# Patient Record
Sex: Female | Born: 2000 | Race: Black or African American | Hispanic: No | Marital: Single | Smoking: Never smoker
Health system: Southern US, Community
[De-identification: ages and names within clinical notes are randomized; demographics above are authoritative.]

## PROBLEM LIST (undated history)

## (undated) ENCOUNTER — Inpatient Hospital Stay (HOSPITAL_COMMUNITY): Payer: Self-pay

## (undated) DIAGNOSIS — Z789 Other specified health status: Secondary | ICD-10-CM

## (undated) HISTORY — PX: WISDOM TOOTH EXTRACTION: SHX21

---
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
## 2019-05-08 ENCOUNTER — Encounter: Payer: Self-pay | Admitting: Nurse Practitioner

## 2019-05-08 DIAGNOSIS — Z3046 Encounter for surveillance of implantable subdermal contraceptive: Secondary | ICD-10-CM | POA: Insufficient documentation

## 2019-05-08 DIAGNOSIS — Z8759 Personal history of other complications of pregnancy, childbirth and the puerperium: Secondary | ICD-10-CM | POA: Insufficient documentation

## 2019-05-09 ENCOUNTER — Ambulatory Visit: Payer: Medicaid Other | Admitting: Nurse Practitioner

## 2019-07-08 ENCOUNTER — Other Ambulatory Visit: Payer: Self-pay

## 2019-07-08 ENCOUNTER — Ambulatory Visit: Payer: Medicaid Other

## 2019-07-08 DIAGNOSIS — R350 Frequency of micturition: Secondary | ICD-10-CM

## 2019-07-08 NOTE — Progress Notes (Signed)
Patient is in the office to leave a urine sample for possible UTI. Pt reports urinary frequency.

## 2019-07-10 LAB — URINE CULTURE

## 2019-07-10 NOTE — Progress Notes (Signed)
I have reviewed this chart and agree with the RN/CMA assessment and management.    K. Meryl Davis, M.D. [facility]   

## 2019-11-18 ENCOUNTER — Other Ambulatory Visit: Payer: Self-pay

## 2019-11-18 ENCOUNTER — Other Ambulatory Visit (HOSPITAL_COMMUNITY)
Admission: RE | Admit: 2019-11-18 | Discharge: 2019-11-18 | Disposition: A | Discharge status: Home / Self Care | Payer: Medicaid Other | Source: Ambulatory Visit | Attending: Obstetrics | Admitting: Obstetrics

## 2019-11-18 ENCOUNTER — Ambulatory Visit (INDEPENDENT_AMBULATORY_CARE_PROVIDER_SITE_OTHER): Payer: Medicaid Other

## 2019-11-18 VITALS — Wt 130.6 lb

## 2019-11-18 DIAGNOSIS — Z202 Contact with and (suspected) exposure to infections with a predominantly sexual mode of transmission: Secondary | ICD-10-CM | POA: Diagnosis not present

## 2019-11-18 NOTE — Progress Notes (Signed)
Pt is here for STD screening, she requests vaginal swab only and declines bloodwork at this time. Pt denies any new partners or known exposure but states she would just like to be tested. Advised pt on how to do self swab and advised her we will notify her of results, pt voices understanding.

## 2019-11-19 LAB — CERVICOVAGINAL ANCILLARY ONLY
Bacterial Vaginitis (gardnerella): POSITIVE — AB
Candida Glabrata: NEGATIVE
Candida Vaginitis: NEGATIVE
Chlamydia: NEGATIVE
Comment: NEGATIVE
Comment: NEGATIVE
Comment: NEGATIVE
Comment: NEGATIVE
Comment: NEGATIVE
Comment: NORMAL
Neisseria Gonorrhea: NEGATIVE
Trichomonas: NEGATIVE

## 2019-11-20 ENCOUNTER — Other Ambulatory Visit: Payer: Self-pay | Admitting: Obstetrics

## 2019-11-20 DIAGNOSIS — B9689 Other specified bacterial agents as the cause of diseases classified elsewhere: Secondary | ICD-10-CM

## 2019-11-20 MED ORDER — METRONIDAZOLE 500 MG PO TABS: 500.0000 mg | ORAL_TABLET | Freq: Two times a day (BID) | ORAL | 2 refills | Status: DC

## 2019-12-17 NOTE — Progress Notes (Signed)
Patient was assessed and managed by nursing staff during this encounter. I have reviewed the chart and agree with the documentation and plan. I have also made any necessary editorial changes.  Catalina Antigua, MD 12/17/2019 3:21 PM

## 2020-04-26 IMAGING — US US OB < 14 WEEKS - US OB TV
1 series · 15 of 28 positions shown · non-contrast
Comparison: None.

CLINICAL DATA: 17-year-old pregnant female with shortness of
breath. LMP: 12/31/2017 corresponding to an estimated gestational
age of 6 weeks, 3 days.

EXAM:
OBSTETRIC <14 WK US AND TRANSVAGINAL OB US
TECHNIQUE: Both transabdominal and transvaginal ultrasound examinations were
performed for complete evaluation of the gestation as well as the
maternal uterus, adnexal regions, and pelvic cul-de-sac.
Transvaginal technique was performed to assess early pregnancy.

[Series 1: us ob < 14 weeks - us ob tv · 48 acquisitions, 15 frames shown]
[im 1/48]
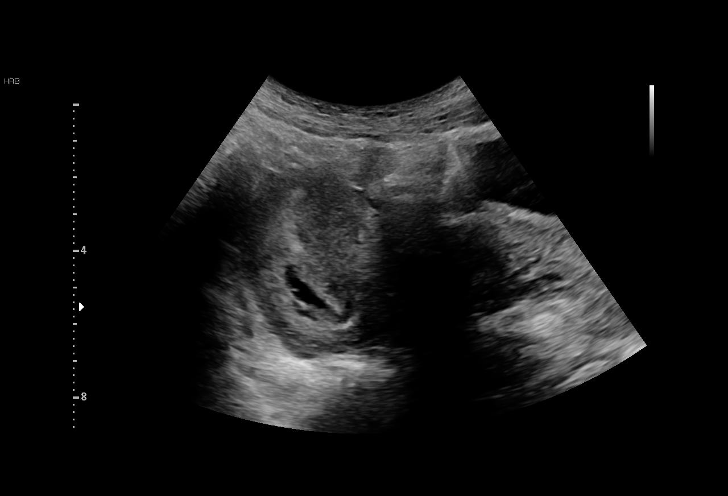
[im 4/48]
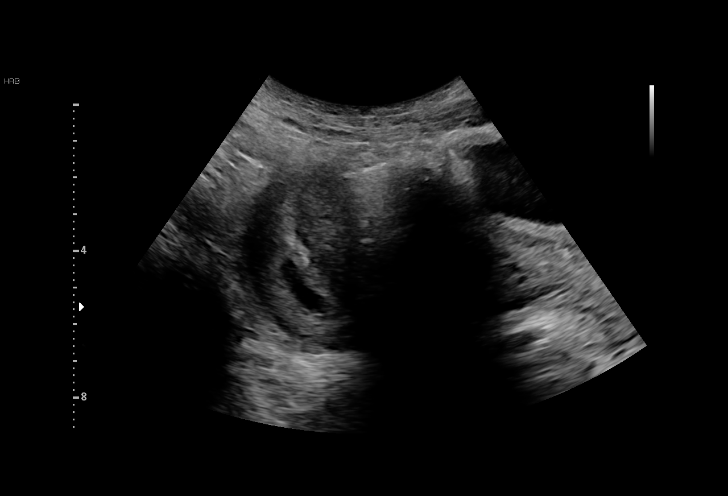
[im 7/48]
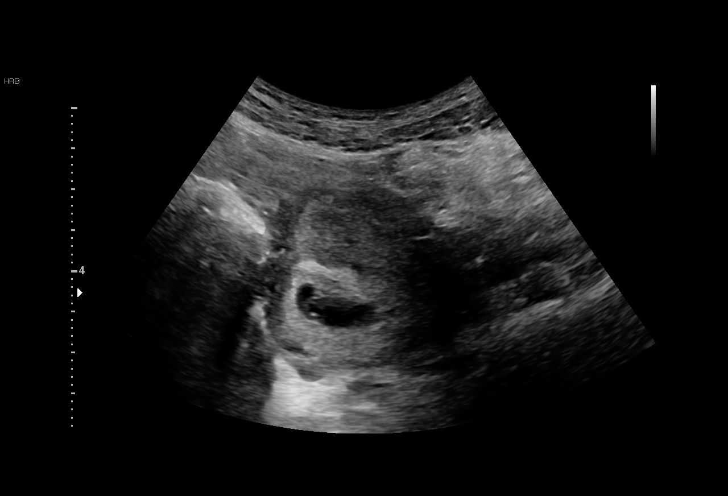
[im 11/48]
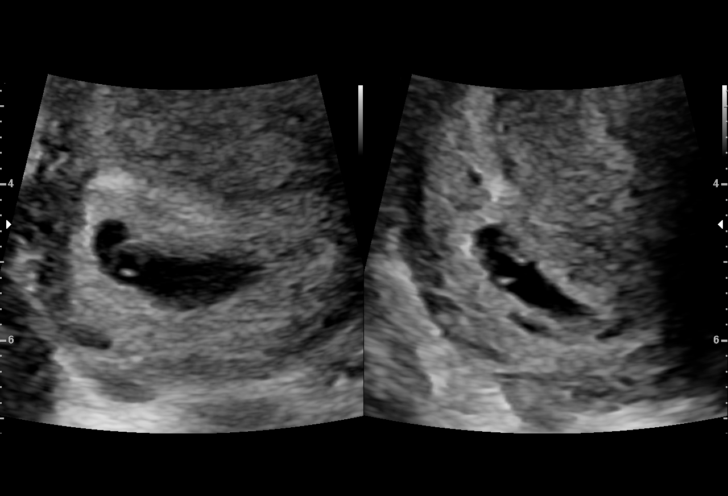
[im 14/48]
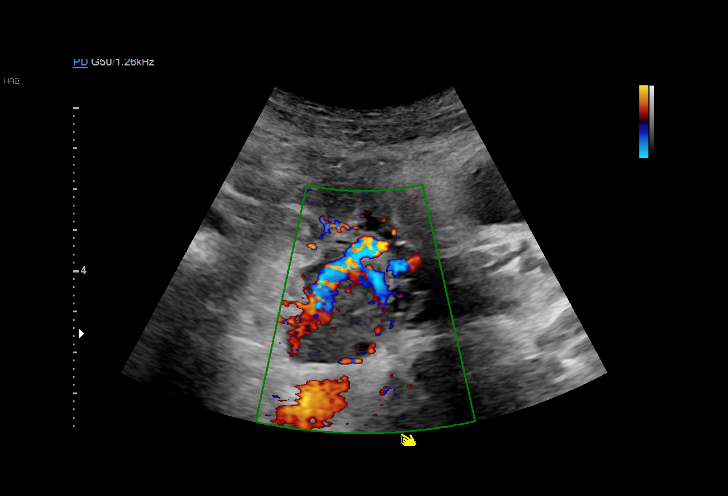
[im 18/48]
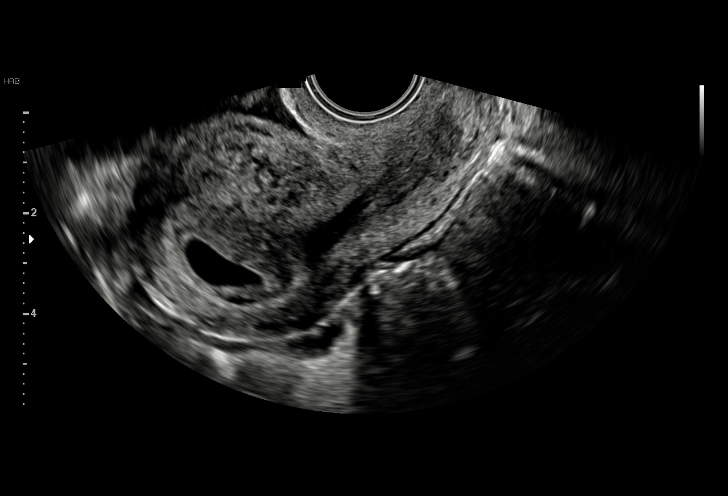
[im 21/48]
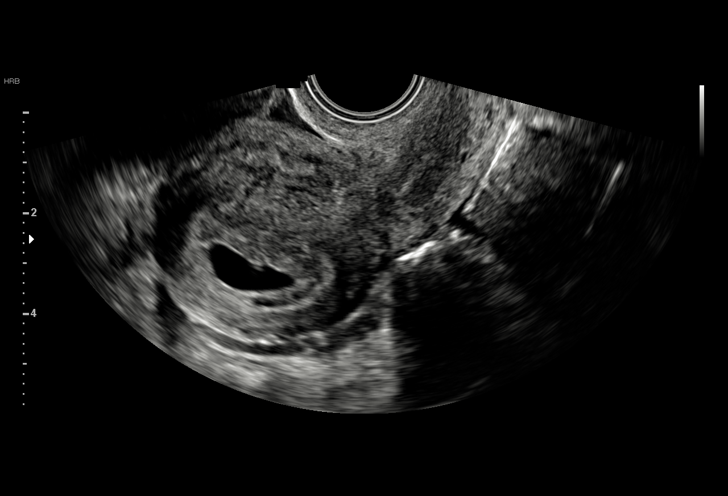
[im 25/48]
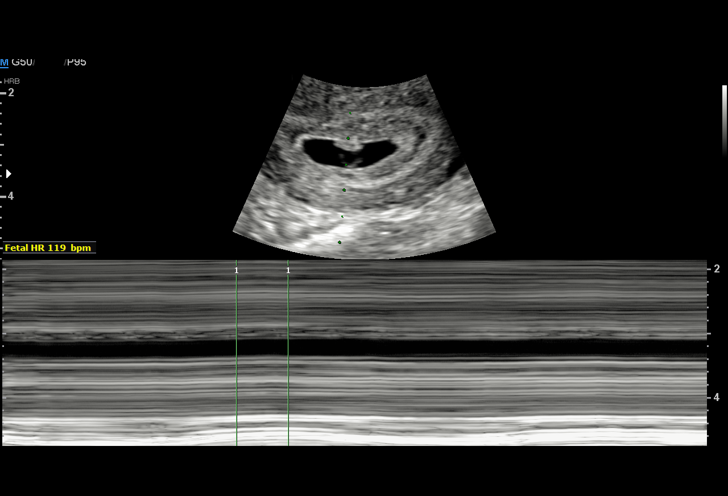
[im 27/48]
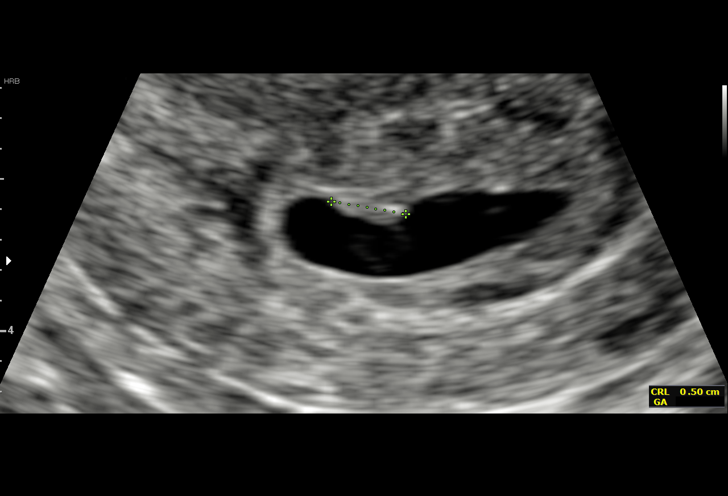
[im 30/48]
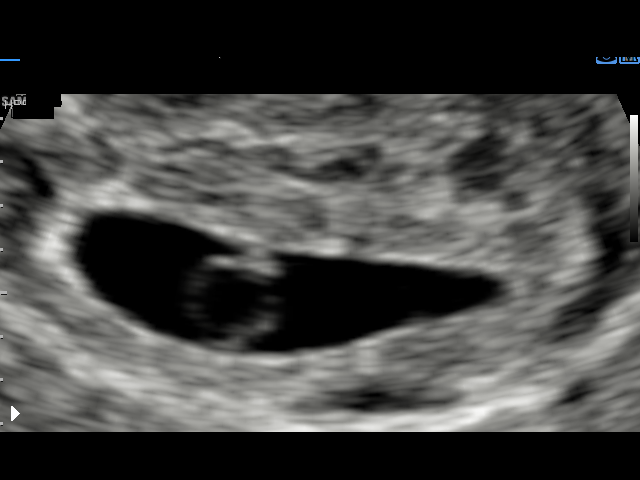
[im 34/48]
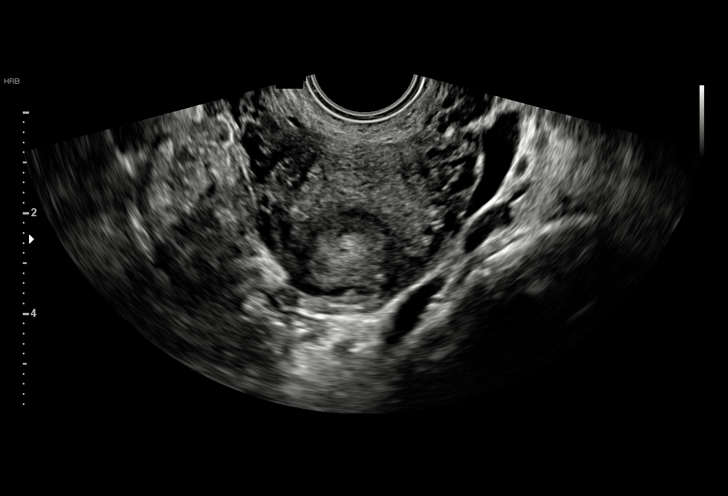
[im 37/48]
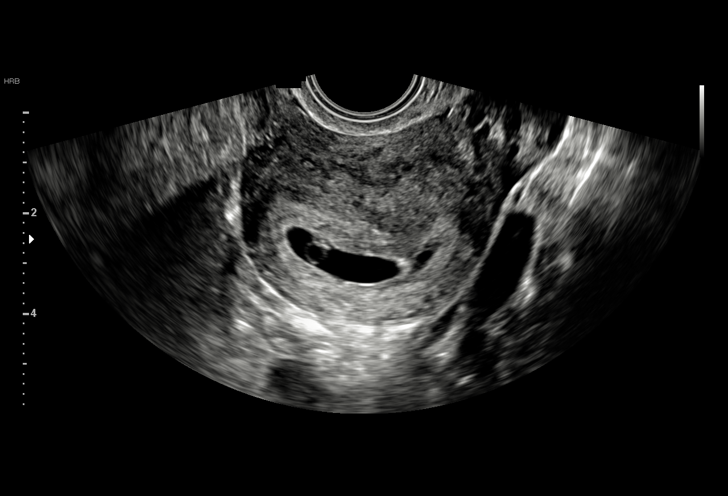
[im 41/48]
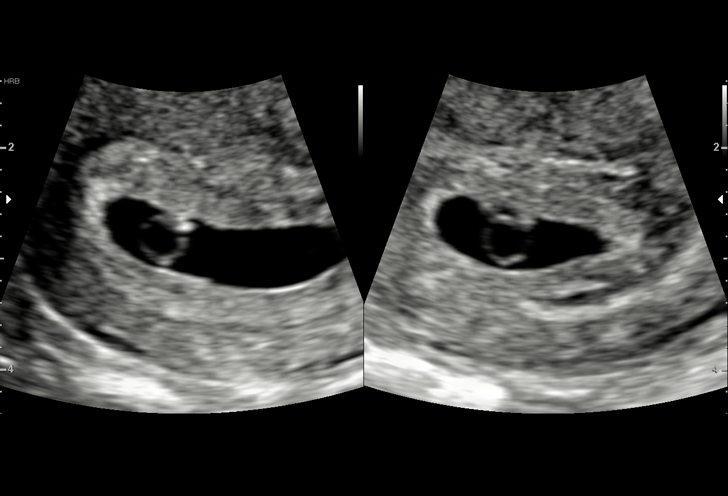
[im 44/48]
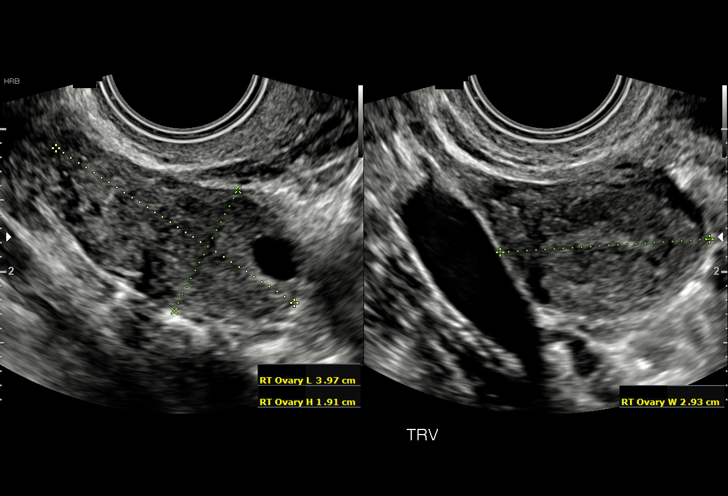
[im 48/48]
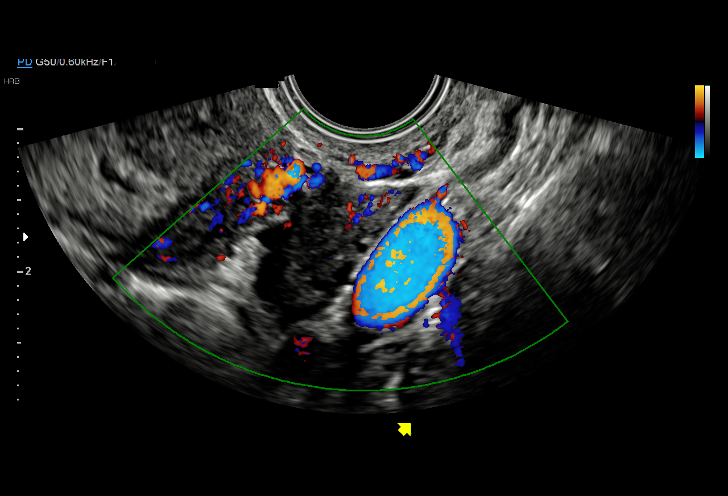

[15 of 28 positions shown; findings below may reference images not displayed]

FINDINGS: Intrauterine gestational sac: Single intrauterine gestational sac.

Yolk sac:  Seen

Embryo:  Present

Cardiac Activity: Detected

Heart Rate: 119 bpm

CRL: 5 mm   6 w   1 d                  US EDC: 10/09/2018

Subchorionic hemorrhage: There is a moderate size subchorionic
hemorrhage.

Maternal uterus/adnexae: The maternal ovaries are unremarkable.
There is a corpus luteum in the right ovary. Small free fluid in the
pelvis.
IMPRESSION: Single live intrauterine pregnancy with an estimated gestational age
of 6 weeks, 1 day based on today's ultrasound.

Moderate size subchorionic hemorrhage.

## 2021-11-10 ENCOUNTER — Ambulatory Visit (INDEPENDENT_AMBULATORY_CARE_PROVIDER_SITE_OTHER): Payer: Medicaid Other | Admitting: Obstetrics

## 2021-11-10 ENCOUNTER — Encounter: Payer: Self-pay | Admitting: Obstetrics

## 2021-11-10 VITALS — BP 107/68 | HR 62 | Ht 66.0 in | Wt 115.0 lb

## 2021-11-10 DIAGNOSIS — Z3046 Encounter for surveillance of implantable subdermal contraceptive: Secondary | ICD-10-CM | POA: Diagnosis not present

## 2021-11-10 MED ORDER — ETONOGESTREL 68 MG ~~LOC~~ IMPL
68.0000 mg | DRUG_IMPLANT | Freq: Once | SUBCUTANEOUS | Status: AC
  Administered 2021-11-11: 68 mg via SUBCUTANEOUS

## 2021-11-10 NOTE — Progress Notes (Addendum)
21 y.o GYN presents for Nexplanon removal and Insert.  C/o heavy, prolong periods lasting 30 days. ? ?Administrations This Visit   ? ? etonogestrel (NEXPLANON) implant 68 mg   ? ? Admin Date ?11/11/2021 Action ?Given Dose ?68 mg Route ?Subdermal Administered By ?Maretta Bees, RMA  ? ?  ?  ? ?  ?  ?

## 2021-11-10 NOTE — Progress Notes (Addendum)
NEXPLANON REMOVAL NOTE ? ?Date of LMP:   unknown ? ?Contraception used: *Nexplanon ? ? ?Indications:  The patient desires removal / reinsertion of Nexplanon.  She understands risks, benefits, and alternatives to Implanon and would like to proceed. ? ?Anesthesia:   Lidocaine 1% plain. ? ?Procedure:  A time-out was performed confirming the procedure and the patient's allergy status.  Complications: None ?                     The rod was palpated and the area was sterilely prepped.  The area beneath the distal tip was anesthetized with 1% xylocaine and the skin incised                       Over the tip and the tip was exposed, grasped with forcep and removed intact.  A single suture of 4-0 Vicryl was used to close incision.  Steri strip  ?                     And a bandage applied and the arm was wrapped with gauze bandage.  The patient tolerated well.  ?Instructions:  The patient was instructed to remove the dressing in 24 hours and that some bruising is to be expected.  She was advised to use over the counter analgesics as needed for any pain at the site.  She is to keep the area dry for 24 hours and to call if her hand or arm becomes cold, numb, or blue. ? ?Nexpllanon ?Lot#:  [id number] ?EXP:  [id number] ? ? ?Return visit:  Return in 2 weeks  ? ?Brock Bad, MD ?11/10/2021 10:33 AM  ?

## 2021-11-11 DIAGNOSIS — Z3046 Encounter for surveillance of implantable subdermal contraceptive: Secondary | ICD-10-CM | POA: Diagnosis not present

## 2021-11-24 ENCOUNTER — Ambulatory Visit: Payer: Medicaid Other | Admitting: Obstetrics

## 2021-11-26 ENCOUNTER — Ambulatory Visit (INDEPENDENT_AMBULATORY_CARE_PROVIDER_SITE_OTHER): Payer: Medicaid Other | Admitting: Obstetrics

## 2021-11-26 ENCOUNTER — Encounter: Payer: Self-pay | Admitting: Obstetrics

## 2021-11-26 VITALS — BP 119/73 | HR 73 | Wt 117.0 lb

## 2021-11-26 DIAGNOSIS — Z3046 Encounter for surveillance of implantable subdermal contraceptive: Secondary | ICD-10-CM

## 2021-11-26 NOTE — Progress Notes (Signed)
Subjective:  ? ? Laurie Davis is a 21 y.o. female who presents for follow-up after Nexplanon Insertion. The patient has no complaints today. The patient is sexually active. Pertinent past medical history: none. ? ?The information documented in the HPI was reviewed and verified. ? ?Menstrual History: ?OB History   ? ? Gravida  ?1  ? Para  ?1  ? Term  ?   ? Preterm  ?1  ? AB  ?   ? Living  ?1  ?  ? ? SAB  ?   ? IAB  ?   ? Ectopic  ?   ? Multiple  ?   ? Live Births  ?1  ?   ?  ?  ?  ? ?Patient's last menstrual period was 11/01/2021. ?  ?Patient Active Problem List  ? Diagnosis Date Noted  ? Implantable subdermal contraceptive surveillance 05/08/2019  ? History of preterm premature rupture of membranes (PPROM) 05/08/2019  ? ?Past Medical History:  ?Diagnosis Date  ? Medical history non-contributory   ?  ?Past Surgical History:  ?Procedure Laterality Date  ? WISDOM TOOTH EXTRACTION    ?  ? ?Current Outpatient Medications:  ?  etonogestrel (NEXPLANON) 68 MG IMPL implant, 1 each by Subdermal route once., Disp: , Rfl:  ?  metroNIDAZOLE (FLAGYL) 500 MG tablet, Take 1 tablet (500 mg total) by mouth 2 (two) times daily. (Patient not taking: Reported on 11/26/2021), Disp: 14 tablet, Rfl: 2 ?  Prenatal Vit-Fe Fumarate-FA (PRENATAL COMPLETE) 14-0.4 MG TABS, Take 1 tablet by mouth daily. (Patient not taking: Reported on 07/08/2019), Disp: 60 each, Rfl: 11 ?No Known Allergies  ?Social History  ? ?Tobacco Use  ? Smoking status: Never  ? Smokeless tobacco: Never  ?Substance Use Topics  ? Alcohol use: No  ?  ?Family History  ?Problem Relation Age of Onset  ? Hypertension Mother   ? Diabetes Mother   ? Anxiety disorder Mother   ? Depression Mother   ? Obesity Mother   ? Diabetes Father   ? Early death Maternal Grandmother   ?  ? ? ? ?Review of Systems ?Constitutional: negative for weight loss ?Genitourinary:negative for abnormal menstrual periods and vaginal discharge ? ? ?Objective:  ? ?BP 119/73   Pulse 73   Wt 117 lb (53.1 kg)    LMP 11/01/2021   BMI 18.88 kg/m?  ?  ?General:   Alert and no distress  ?Skin:   no rash or abnormalities  ?Lungs:   clear to auscultation bilaterally  ?Heart:   regular rate and rhythm, S1, S2 normal, no murmur, click, rub or gallop  ?Left Upper Extremity:  Nexplanon insertion site is clean, dry, intact and non tender.  Rod palpated, intact. ? ? ?Lab Review ?Urine pregnancy test ?Labs reviewed yes ?Radiologic studies reviewed no ? ?I have spent a total of 20 minutes of face-to-face time, excluding clinical staff time, reviewing notes and preparing to see patient, ordering tests and/or medications, and counseling the patient.  ? ?Assessment:  ? ? 21 y.o., continuing Nexplanon, no contraindications.  ? ?Plan:  ? ?1. Encounter for surveillance of Nexplanon subdermal contraceptive ?- post-insertion follow-up ?  ? All questions answered. ?Contraception: Nexplanon. ?Discussed healthy lifestyle modifications. ?Follow up in 6 weeks.  Annual / Pap ? ? ?Brock Bad, MD ?11/26/2021 9:47 AM  ?

## 2021-11-26 NOTE — Progress Notes (Signed)
Pt presents to follow up after Nexplanon insertion.  ?

## 2021-12-01 ENCOUNTER — Telehealth (INDEPENDENT_AMBULATORY_CARE_PROVIDER_SITE_OTHER): Payer: Medicaid Other | Admitting: Obstetrics

## 2021-12-01 ENCOUNTER — Encounter: Payer: Self-pay | Admitting: Obstetrics

## 2021-12-01 DIAGNOSIS — N939 Abnormal uterine and vaginal bleeding, unspecified: Secondary | ICD-10-CM

## 2021-12-01 DIAGNOSIS — Z3046 Encounter for surveillance of implantable subdermal contraceptive: Secondary | ICD-10-CM | POA: Diagnosis not present

## 2021-12-01 MED ORDER — ORTHO-NOVUM 1/35 (28) 1-35 MG-MCG PO TABS: 1.0000 | ORAL_TABLET | Freq: Every day | ORAL | 2 refills | Status: DC

## 2021-12-01 NOTE — Progress Notes (Signed)
? ? ?  GYNECOLOGY VIRTUAL VISIT ENCOUNTER NOTE ? ?Provider location: [facility] [place]  ? ?Patient location: Home ? ?I connected with Bellatrix Saetern on 12/01/21 at  9:35 AM EDT by MyChart Video Encounter and verified that I am speaking with the correct person using two identifiers. ?  ?I discussed the limitations, risks, security and privacy concerns of performing an evaluation and management service virtually and the availability of in person appointments. I also discussed with the patient that there may be a patient responsible charge related to this service. The patient expressed understanding and agreed to proceed. ?  ?History:  ?Shala Schrier is a 21 y.o. G65P0101 female being evaluated today for AUB with Nexplanon. She denies any abnormal vaginal discharge, pelvic pain or other concerns.   ?  ?  ?Past Medical History:  ?Diagnosis Date  ? Medical history non-contributory   ? ?Past Surgical History:  ?Procedure Laterality Date  ? WISDOM TOOTH EXTRACTION    ? ?The following portions of the patient's history were reviewed and updated as appropriate: allergies, current medications, past family history, past medical history, past social history, past surgical history and problem list.  ? ? ?Review of Systems:  ?Pertinent items noted in HPI and remainder of comprehensive ROS otherwise negative. ? ?Physical Exam:  ? ?General:  Alert, oriented and cooperative. Patient appears to be in no acute distress.  ?Mental Status: Normal mood and affect. Normal behavior. Normal judgment and thought content.   ?Respiratory: Normal respiratory effort, no problems with respiration noted  ?Rest of physical exam deferred due to type of encounter ? ?Labs and Imaging ?No results found for this or any previous visit (from the past 336 hour(s)). ?No results found.   ?  ?Assessment and Plan:  ?   ?1. Encounter for surveillance of Nexplanon subdermal contraceptive ? ?2. Abnormal uterine bleeding (AUB) ?Rx: ?-  norethindrone-ethinyl estradiol 1/35 (ORTHO-NOVUM 1/35, 28,) tablet; Take 1 tablet by mouth daily.  Dispense: 28 tablet; Refill: 2 ? ?   ?  ?I discussed the assessment and treatment plan with the patient. The patient was provided an opportunity to ask questions and all were answered. The patient agreed with the plan and demonstrated an understanding of the instructions. ?  ?The patient was advised to call back or seek an in-person evaluation/go to the ED if the symptoms worsen or if the condition fails to improve as anticipated. ? ?I have spent a total of 15 minutes of non-face-to-face time, excluding clinical staff time, reviewing notes and preparing to see patient, ordering tests and/or medications, and counseling the patient.  ? ? ?Coral Ceo, MD ?[facility] for [facility], [facility], [facility] ?12/01/21  ?

## 2021-12-01 NOTE — Progress Notes (Signed)
Patient presents for Mychart visit. Patient verified with two patient identifiers. Patient states that she had nexplanon removal and reinsertion on 4/12. She states that she has been bleeding heavy since the first week of April.  ?

## 2022-01-07 ENCOUNTER — Ambulatory Visit: Payer: Medicaid Other | Admitting: Obstetrics

## 2022-02-08 ENCOUNTER — Encounter: Payer: Self-pay | Admitting: Obstetrics & Gynecology

## 2022-02-08 ENCOUNTER — Ambulatory Visit (INDEPENDENT_AMBULATORY_CARE_PROVIDER_SITE_OTHER): Payer: Medicaid Other | Admitting: Obstetrics & Gynecology

## 2022-02-08 ENCOUNTER — Other Ambulatory Visit (HOSPITAL_COMMUNITY)
Admission: RE | Admit: 2022-02-08 | Discharge: 2022-02-08 | Disposition: A | Discharge status: Home / Self Care | Payer: Medicaid Other | Source: Ambulatory Visit | Attending: Obstetrics | Admitting: Obstetrics

## 2022-02-08 VITALS — BP 106/70 | HR 64 | Ht 66.0 in | Wt 121.0 lb

## 2022-02-08 DIAGNOSIS — Z01419 Encounter for gynecological examination (general) (routine) without abnormal findings: Secondary | ICD-10-CM

## 2022-02-08 DIAGNOSIS — Z3046 Encounter for surveillance of implantable subdermal contraceptive: Secondary | ICD-10-CM | POA: Diagnosis not present

## 2022-02-08 NOTE — Progress Notes (Signed)
Patient ID: Laurie Davis, female   DOB: Feb 19, 2001, 21 y.o.   MRN: [id number]  CC: annual exam   HPI Laurie Davis is a 21 y.o. female.  G1P0101 Patient's last menstrual period was 01/27/2022 (approximate). She has Nexplanon and is on 3rd OCP pack for DUB. First pap test HPI  Past Medical History:  Diagnosis Date   Medical history non-contributory     Past Surgical History:  Procedure Laterality Date   WISDOM TOOTH EXTRACTION      Family History  Problem Relation Age of Onset   Hypertension Mother    Diabetes Mother    Anxiety disorder Mother    Depression Mother    Obesity Mother    Diabetes Father    Early death Maternal Grandmother     Social History Social History   Tobacco Use   Smoking status: Never   Smokeless tobacco: Never  Vaping Use   Vaping Use: Never used  Substance Use Topics   Alcohol use: No   Drug use: No    No Known Allergies  Current Outpatient Medications  Medication Sig Dispense Refill   etonogestrel (NEXPLANON) 68 MG IMPL implant 1 each by Subdermal route once.     No current facility-administered medications for this visit.    Review of Systems Review of Systems  Constitutional: Negative.   Respiratory: Negative.    Cardiovascular: Negative.   Gastrointestinal: Negative.   Genitourinary:  Positive for menstrual problem. Negative for pelvic pain and vaginal discharge.    Blood pressure 106/70, pulse 64, height 5\' 6"  (1.676 m), weight 121 lb (54.9 kg), last menstrual period 01/27/2022.  Physical Exam Physical Exam Vitals and nursing note reviewed. Exam conducted with a chaperone present.  Constitutional:      Appearance: Normal appearance. She is not ill-appearing.  Cardiovascular:     Rate and Rhythm: Normal rate.  Pulmonary:     Effort: Pulmonary effort is normal.  Abdominal:     General: Abdomen is flat.     Palpations: Abdomen is soft.  Genitourinary:    General: Normal vulva.     Exam position: Lithotomy position.      Vagina: Normal.     Cervix: Normal.     Uterus: Normal.      Adnexa: Right adnexa normal and left adnexa normal.  Skin:    General: Skin is warm and dry.  Neurological:     General: No focal deficit present.     Mental Status: She is alert.  Psychiatric:        Mood and Affect: Mood normal.        Behavior: Behavior normal.     Data Reviewed Office notes  Assessment Implantable subdermal contraceptive surveillance  Well woman exam with routine gynecological exam - Plan: Cervicovaginal ancillary only( [place]), Cytology - PAP May stop OCP after this pack  Plan RTC annual F/u on STD screen    01/29/2022 02/08/2022, 11:06 AM

## 2022-02-08 NOTE — Progress Notes (Signed)
Annual GYN STI swab, declines blood work Has Nexplanon, just replaced, irreg bleeding, RX OCP by Dr. Clearance Coots, reports improvement

## 2022-02-10 LAB — CYTOLOGY - PAP
Chlamydia: NEGATIVE
Comment: NEGATIVE
Comment: NORMAL
Diagnosis: NEGATIVE
Diagnosis: REACTIVE
Neisseria Gonorrhea: NEGATIVE

## 2022-02-10 LAB — CERVICOVAGINAL ANCILLARY ONLY
Chlamydia: NEGATIVE
Comment: NEGATIVE
Comment: NEGATIVE
Comment: NORMAL
Neisseria Gonorrhea: NEGATIVE
Trichomonas: NEGATIVE

## 2022-03-03 ENCOUNTER — Other Ambulatory Visit: Payer: Self-pay | Admitting: Obstetrics

## 2022-03-22 ENCOUNTER — Ambulatory Visit
Admission: EM | Admit: 2022-03-22 | Discharge: 2022-03-22 | Disposition: A | Discharge status: Home / Self Care | Payer: Medicaid Other

## 2022-03-22 ENCOUNTER — Ambulatory Visit (INDEPENDENT_AMBULATORY_CARE_PROVIDER_SITE_OTHER): Payer: Medicaid Other

## 2022-03-22 DIAGNOSIS — M79671 Pain in right foot: Secondary | ICD-10-CM

## 2022-03-22 MED ORDER — PREDNISONE 20 MG PO TABS: 40.0000 mg | ORAL_TABLET | Freq: Every day | ORAL | 0 refills | Status: AC

## 2022-03-22 NOTE — ED Triage Notes (Signed)
Pt reports right heel pain x 1 week. States she feels pain travels from right heel to right leg and lower back. Pian is worse wen pt step on. Ibuprofen gives no relief.

## 2022-03-22 NOTE — Discharge Instructions (Signed)
Try Epsom salt soaks, heel inserts for more support, Stretches, massage

## 2022-03-22 NOTE — ED Provider Notes (Signed)
[facility]    CSN: [id number] Arrival date & time: 03/22/22  1628      History   Chief Complaint Chief Complaint  Patient presents with   Foot Pain    HPI Asia Burruel is a 21 y.o. female.   Presenting today with 1.5-week history of significant right heel pain that sometimes shoots down the foot and other times shoots up toward the lower leg.  States pain is significantly worse with weightbearing.  Denies any known injury but does stand and walk all day for work, thinks this may have something to do with it.  Denies swelling, discoloration, decreased range of motion.  Trying ibuprofen with minimal relief.    Past Medical History:  Diagnosis Date   Medical history non-contributory     Patient Active Problem List   Diagnosis Date Noted   Implantable subdermal contraceptive surveillance 05/08/2019   History of preterm premature rupture of membranes (PPROM) 05/08/2019    Past Surgical History:  Procedure Laterality Date   WISDOM TOOTH EXTRACTION      OB History     Gravida  1   Para  1   Term      Preterm  1   AB      Living  1      SAB      IAB      Ectopic      Multiple      Live Births  1            Home Medications    Prior to Admission medications   Medication Sig Start Date End Date Taking? Authorizing Provider  ibuprofen (ADVIL) 200 MG tablet Take 200 mg by mouth every 6 (six) hours as needed.   Yes [provider]  predniSONE (DELTASONE) 20 MG tablet Take 2 tablets (40 mg total) by mouth daily with breakfast. 03/22/22  Yes Particia Nearing, PA-C  etonogestrel (NEXPLANON) 68 MG IMPL implant 1 each by Subdermal route once. 11/10/21   [provider]    Family History Family History  Problem Relation Age of Onset   Hypertension Mother    Diabetes Mother    Anxiety disorder Mother    Depression Mother    Obesity Mother    Diabetes Father    Early death Maternal Grandmother     Social  History Social History   Tobacco Use   Smoking status: Never   Smokeless tobacco: Never  Vaping Use   Vaping Use: Never used  Substance Use Topics   Alcohol use: No   Drug use: Never     Allergies   Patient has no known allergies.   Review of Systems Review of Systems Per HPI  Physical Exam Triage Vital Signs ED Triage Vitals  Enc Vitals Group     BP 03/22/22 1704 106/67     Pulse Rate 03/22/22 1704 (!) 55     Resp 03/22/22 1704 14     Temp 03/22/22 1704 99 F (37.2 C)     Temp Source 03/22/22 1704 Oral     SpO2 03/22/22 1704 98 %     Weight --      Height --      Head Circumference --      Peak Flow --      Pain Score 03/22/22 1703 10     Pain Loc --      Pain Edu? --      Excl. in GC? --  No data found.  Updated Vital Signs BP 106/67 (BP Location: Right Arm)   Pulse (!) 55   Temp 99 F (37.2 C) (Oral)   Resp 14   SpO2 98%   Visual Acuity Right Eye Distance:   Left Eye Distance:   Bilateral Distance:    Right Eye Near:   Left Eye Near:    Bilateral Near:     Physical Exam Vitals and nursing note reviewed.  Constitutional:      Appearance: Normal appearance. She is not ill-appearing.  HENT:     Head: Atraumatic.  Eyes:     Extraocular Movements: Extraocular movements intact.     Conjunctiva/sclera: Conjunctivae normal.  Cardiovascular:     Rate and Rhythm: Normal rate and regular rhythm.     Heart sounds: Normal heart sounds.  Pulmonary:     Effort: Pulmonary effort is normal.     Breath sounds: Normal breath sounds.  Musculoskeletal:        General: Tenderness present. No swelling, deformity or signs of injury. Normal range of motion.     Cervical back: Normal range of motion and neck supple.     Comments: Tenderness to palpation plantar surface of right heel.  No bony deformity palpable, no edema, range of motion intact.  Antalgic gait  Skin:    General: Skin is warm and dry.     Findings: No bruising or erythema.  Neurological:      Mental Status: She is alert and oriented to person, place, and time.     Comments: Right lower extremity neurovascularly intact  Psychiatric:        Mood and Affect: Mood normal.        Thought Content: Thought content normal.        Judgment: Judgment normal.    UC Treatments / Results  Labs (all labs ordered are listed, but only abnormal results are displayed) Labs Reviewed - No data to display  EKG   Radiology DG Foot Complete Right  Result Date: 03/22/2022 CLINICAL DATA:  Pt reports right heel pain x 1 week. States she feels pain travels from right heel to right leg and lower back. EXAM: RIGHT FOOT COMPLETE - 3+ VIEW COMPARISON:  None Available. FINDINGS: There is no evidence of fracture or dislocation. There is no evidence of arthropathy or other focal bone abnormality. Soft tissues are unremarkable. IMPRESSION: Negative. Electronically Signed   By: Emmaline Kluver M.D.   On: 03/22/2022 17:35    Procedures Procedures (including critical care time)  Medications Ordered in UC Medications - No data to display  Initial Impression / Assessment and Plan / UC Course  I have reviewed the triage vital signs and the nursing notes.  Pertinent labs & imaging results that were available during my care of the patient were reviewed by me and considered in my medical decision making (see chart for details).     X-ray of the right foot negative for acute bony abnormality, suspect a tendinitis or planter fasciitis type issue.  We will treat with a course of prednisone, Epsom salt soaks, stretches, rest, ice, elevation, shoe supports.  Work note given.  Return for worsening symptoms.  Final Clinical Impressions(s) / UC Diagnoses   Final diagnoses:  Pain of right heel     Discharge Instructions      Try Epsom salt soaks, heel inserts for more support, Stretches, massage    ED Prescriptions     Medication Sig Dispense Auth. Provider   predniSONE (  DELTASONE) 20 MG tablet  Take 2 tablets (40 mg total) by mouth daily with breakfast. 10 tablet Particia Nearing, [place]      PDMP not reviewed this encounter.   Particia Nearing, [place] 03/22/22 1906

## 2022-05-11 ENCOUNTER — Other Ambulatory Visit (HOSPITAL_COMMUNITY)
Admission: RE | Admit: 2022-05-11 | Discharge: 2022-05-11 | Disposition: A | Discharge status: Home / Self Care | Payer: Medicaid Other | Source: Ambulatory Visit | Attending: Obstetrics and Gynecology | Admitting: Obstetrics and Gynecology

## 2022-05-11 ENCOUNTER — Ambulatory Visit (INDEPENDENT_AMBULATORY_CARE_PROVIDER_SITE_OTHER): Payer: Medicaid Other | Admitting: *Deleted

## 2022-05-11 VITALS — BP 118/81 | HR 73

## 2022-05-11 DIAGNOSIS — Z113 Encounter for screening for infections with a predominantly sexual mode of transmission: Secondary | ICD-10-CM

## 2022-05-11 MED ORDER — METRONIDAZOLE 500 MG PO TABS: 500.0000 mg | ORAL_TABLET | Freq: Two times a day (BID) | ORAL | 0 refills | Status: AC

## 2022-05-11 NOTE — Progress Notes (Signed)
SUBJECTIVE:  21 y.o. female who desires a STI screen. Denies abnormal vaginal discharge, bleeding or significant pelvic pain. No UTI symptoms. Had known exposure to STD.  No LMP recorded. Patient has had an implant.  OBJECTIVE:  She appears well.   ASSESSMENT:  STI Screen   PLAN:  Pt offered STI blood screening-requested GC, chlamydia, and trichomonas probe sent to lab.  Treatment: To be determined once lab results are received.  Pt follow up as needed.  Dr. Rip Harbour was consulted due to known exposure to trichomonas. RX sent for Flagyl.

## 2022-05-11 NOTE — Progress Notes (Signed)
Agree with nurses's documentation of this patient's clinic encounter.  Morris Markham L, MD  

## 2022-05-12 LAB — CERVICOVAGINAL ANCILLARY ONLY
Bacterial Vaginitis (gardnerella): NEGATIVE
Candida Glabrata: NEGATIVE
Candida Vaginitis: NEGATIVE
Chlamydia: NEGATIVE
Comment: NEGATIVE
Comment: NEGATIVE
Comment: NEGATIVE
Comment: NEGATIVE
Comment: NEGATIVE
Comment: NORMAL
Neisseria Gonorrhea: NEGATIVE
Trichomonas: NEGATIVE

## 2022-05-12 LAB — RPR: RPR Ser Ql: NONREACTIVE

## 2022-05-12 LAB — HEPATITIS C ANTIBODY: Hep C Virus Ab: NONREACTIVE

## 2022-05-12 LAB — HIV ANTIBODY (ROUTINE TESTING W REFLEX): HIV Screen 4th Generation wRfx: NONREACTIVE

## 2022-05-12 LAB — HEPATITIS B SURFACE ANTIGEN: Hepatitis B Surface Ag: NEGATIVE

## 2022-11-24 ENCOUNTER — Other Ambulatory Visit (HOSPITAL_COMMUNITY)
Admission: RE | Admit: 2022-11-24 | Discharge: 2022-11-24 | Disposition: A | Discharge status: Home / Self Care | Payer: Medicaid Other | Source: Ambulatory Visit | Attending: Student | Admitting: Student

## 2022-11-24 ENCOUNTER — Encounter: Payer: Self-pay | Admitting: Student

## 2022-11-24 ENCOUNTER — Ambulatory Visit (INDEPENDENT_AMBULATORY_CARE_PROVIDER_SITE_OTHER): Payer: Medicaid Other | Admitting: Student

## 2022-11-24 VITALS — BP 122/80 | HR 83 | Ht 66.0 in | Wt 139.8 lb

## 2022-11-24 DIAGNOSIS — R300 Dysuria: Secondary | ICD-10-CM | POA: Diagnosis not present

## 2022-11-24 DIAGNOSIS — R3915 Urgency of urination: Secondary | ICD-10-CM

## 2022-11-24 LAB — POCT URINALYSIS DIPSTICK
Bilirubin, UA: NEGATIVE
Blood, UA: NEGATIVE
Glucose, UA: NEGATIVE
Ketones, UA: NEGATIVE
Leukocytes, UA: NEGATIVE
Nitrite, UA: NEGATIVE
Protein, UA: POSITIVE — AB
Spec Grav, UA: 1.01 (ref 1.010–1.025)
Urobilinogen, UA: 0.2 E.U./dL — NL
pH, UA: 8.5 — AB (ref 5.0–8.0)

## 2022-11-24 MED ORDER — SULFAMETHOXAZOLE-TRIMETHOPRIM 800-160 MG PO TABS: 1.0000 | ORAL_TABLET | Freq: Two times a day (BID) | ORAL | 0 refills | Status: AC

## 2022-11-24 NOTE — Progress Notes (Signed)
  History:  [name] is a 22 y.o. G1P0101 who presents to clinic today for concern for UTI. Patient reports increased frequency and dysuria x 1 week. Patient has tried OTC cranberry and UTI relief supplements without relief.  Is concerned that this is a postcoital related infection. Denies prior UTIs outside of pregnancy 4 years ago. Has not taking antibiotics recently. Denies bleeding, vaginal discharge or vaginal itching.   The following portions of the patient's history were reviewed and updated as appropriate: allergies, current medications, family history, past medical history, social history, past surgical history and problem list.  Review of Systems:  Review of Systems  Genitourinary:  Positive for dysuria, frequency and urgency. Negative for flank pain and hematuria.  All other systems reviewed and are negative.     Objective:  Physical Exam BP 122/80   Pulse 83   Ht  (1.676 m)   Wt 139 lb 12.8 oz (63.4 kg)   BMI 22.56 kg/m  Physical Exam Vitals and nursing note reviewed.  Constitutional:      Appearance: Normal appearance. She is normal weight.  Cardiovascular:     Rate and Rhythm: Normal rate.  Abdominal:     General: Abdomen is flat.     Palpations: Abdomen is soft.  Genitourinary:    Comments: Deferred exam, patient self-swab Musculoskeletal:        General: Normal range of motion.  Skin:    General: Skin is warm and dry.  Neurological:     General: No focal deficit present.     Mental Status: She is alert and oriented to person, place, and time. Mental status is at baseline.     Labs and Imaging Results for orders placed or performed in visit on 11/24/22 (from the past 24 hour(s))  POCT Urinalysis Dipstick     Status: Abnormal   Collection Time: 11/24/22 12:18 PM  Result Value Ref Range   Color, UA Yellow    Clarity, UA Cloudy    Glucose, UA Negative Negative   Bilirubin, UA Negative    Ketones, UA Negative    Spec Grav, UA 1.010 1.010 -  1.025   Blood, UA Negative    pH, UA 8.5 (A) 5.0 - 8.0   Protein, UA Positive (A) Negative   Urobilinogen, UA negative (A) 0.2 or 1.0 E.U./dL   Nitrite, UA Negative    Leukocytes, UA     Appearance     Odor      No results found.  Health Maintenance Due  Topic Date Due   COVID-19 Vaccine (1) Never done   HPV VACCINES (1 - 2-dose series) Never done    Labs, imaging and previous visits in Epic and Care Everywhere reviewed  Assessment & Plan:  1. Urinary urgency 2. Dysuria - POCT Urinalysis Dipstick - Cervicovaginal ancillary only( [place]) - Urine Culture - sulfamethoxazole-trimethoprim (BACTRIM DS) 800-160 MG tablet; Take 1 tablet by mouth 2 (two) times daily.  Dispense: 6 tablet; Refill: 0 - Discussed urinary irritants to avoid and continue increased hydration - Counseled on potential for this to be vaginitis and will treat appropriately per results    Approximately 15  minutes of total time was spent with this patient on counseling and coordination of care and 5 additional minutes were spent reviewing patient's chart  Return if symptoms worsen or fail to improve.  Corlis Hove, NP 11/24/2022 12:23 PM

## 2022-11-24 NOTE — Progress Notes (Signed)
Pt presents for UTI symptoms. Pt reports back pain and urinary urgency. Pt requesting STD testing. No other concerns

## 2022-11-25 LAB — CERVICOVAGINAL ANCILLARY ONLY
Bacterial Vaginitis (gardnerella): POSITIVE — AB
Candida Glabrata: NEGATIVE
Candida Vaginitis: NEGATIVE
Chlamydia: NEGATIVE
Comment: NEGATIVE
Comment: NEGATIVE
Comment: NEGATIVE
Comment: NEGATIVE
Comment: NEGATIVE
Comment: NORMAL
Neisseria Gonorrhea: NEGATIVE
Trichomonas: NEGATIVE

## 2022-11-26 LAB — URINE CULTURE

## 2022-11-28 ENCOUNTER — Other Ambulatory Visit: Payer: Self-pay

## 2022-11-28 DIAGNOSIS — B9689 Other specified bacterial agents as the cause of diseases classified elsewhere: Secondary | ICD-10-CM

## 2022-11-28 MED ORDER — METRONIDAZOLE 500 MG PO TABS: 500.0000 mg | ORAL_TABLET | Freq: Two times a day (BID) | ORAL | 0 refills | Status: AC

## 2023-10-08 ENCOUNTER — Ambulatory Visit
Admission: EM | Admit: 2023-10-08 | Discharge: 2023-10-08 | Disposition: A | Discharge status: Home / Self Care | Attending: Family Medicine | Admitting: Family Medicine

## 2023-10-08 DIAGNOSIS — U071 COVID-19: Secondary | ICD-10-CM

## 2023-10-08 LAB — POC COVID19/FLU A&B COMBO
Covid Antigen, POC: POSITIVE — AB
Influenza A Antigen, POC: NEGATIVE
Influenza B Antigen, POC: NEGATIVE

## 2023-10-08 MED ORDER — ONDANSETRON 4 MG PO TBDP: 4.0000 mg | ORAL_TABLET | Freq: Three times a day (TID) | ORAL | 0 refills | Status: AC | PRN

## 2023-10-08 MED ORDER — PSEUDOEPH-BROMPHEN-DM 30-2-10 MG/5ML PO SYRP: 5.0000 mL | ORAL_SOLUTION | Freq: Four times a day (QID) | ORAL | 0 refills | Status: AC | PRN

## 2023-10-08 NOTE — ED Provider Notes (Signed)
 [facility]    CSN: [id number] Arrival date & time: 10/08/23  1020      History   Chief Complaint No chief complaint on file.   HPI Laurie Davis is a 23 y.o. female.   Presenting today with a history of headache, nausea, body aches, congestion, decreased appetite, cough.  Denies chest pain, shortness of breath, abdominal pain, vomiting, diarrhea.  So far not trying anything over-the-counter for symptoms.  Multiple sick contacts recently.    Past Medical History:  Diagnosis Date   Medical history non-contributory     Patient Active Problem List   Diagnosis Date Noted   Implantable subdermal contraceptive surveillance 05/08/2019   History of preterm premature rupture of membranes (PPROM) 05/08/2019    Past Surgical History:  Procedure Laterality Date   WISDOM TOOTH EXTRACTION      OB History     Gravida  1   Para  1   Term      Preterm  1   AB      Living  1      SAB      IAB      Ectopic      Multiple      Live Births  1            Home Medications    Prior to Admission medications   Medication Sig Start Date End Date Taking? Authorizing Provider  brompheniramine-pseudoephedrine-DM 30-2-10 MG/5ML syrup Take 5 mLs by mouth 4 (four) times daily as needed. 10/08/23  Yes Particia Nearing, PA-C  ondansetron (ZOFRAN-ODT) 4 MG disintegrating tablet Take 1 tablet (4 mg total) by mouth every 8 (eight) hours as needed for nausea or vomiting. 10/08/23  Yes Particia Nearing, PA-C  etonogestrel (NEXPLANON) 68 MG IMPL implant 1 each by Subdermal route once. 11/10/21   [provider]  ibuprofen (ADVIL) 200 MG tablet Take 200 mg by mouth every 6 (six) hours as needed.    [provider]  metroNIDAZOLE (FLAGYL) 500 MG tablet Take 1 tablet (500 mg total) by mouth 2 (two) times daily. Patient not taking: Reported on 11/24/2022 05/11/22   Hermina Staggers, MD  metroNIDAZOLE (FLAGYL) 500 MG tablet Take 1 tablet (500 mg  total) by mouth 2 (two) times daily. 11/28/22   Constant, Peggy, MD  predniSONE (DELTASONE) 20 MG tablet Take 2 tablets (40 mg total) by mouth daily with breakfast. Patient not taking: Reported on 11/24/2022 03/22/22   Particia Nearing, PA-C  sulfamethoxazole-trimethoprim (BACTRIM DS) 800-160 MG tablet Take 1 tablet by mouth 2 (two) times daily. 11/24/22   Corlis Hove, NP    Family History Family History  Problem Relation Age of Onset   Hypertension Mother    Diabetes Mother    Anxiety disorder Mother    Depression Mother    Obesity Mother    Diabetes Father    Early death Maternal Grandmother     Social History Social History   Tobacco Use   Smoking status: Never   Smokeless tobacco: Never  Vaping Use   Vaping status: Never Used  Substance Use Topics   Alcohol use: No   Drug use: Never     Allergies   Patient has no known allergies.   Review of Systems Review of Systems Per HPI  Physical Exam Triage Vital Signs ED Triage Vitals  Encounter Vitals Group     BP 10/08/23 1027 120/77     Systolic BP Percentile --  Diastolic BP Percentile --      Pulse Rate 10/08/23 1027 (!) 56     Resp 10/08/23 1027 14     Temp 10/08/23 1027 98.7 F (37.1 C)     Temp Source 10/08/23 1027 Oral     SpO2 10/08/23 1027 98 %     Weight --      Height --      Head Circumference --      Peak Flow --      Pain Score 10/08/23 1030 10     Pain Loc --      Pain Education --      Exclude from Growth Chart --    No data found.  Updated Vital Signs BP 120/77 (BP Location: Right Arm)   Pulse (!) 56   Temp 98.7 F (37.1 C) (Oral)   Resp 14   SpO2 98%   Visual Acuity Right Eye Distance:   Left Eye Distance:   Bilateral Distance:    Right Eye Near:   Left Eye Near:    Bilateral Near:     Physical Exam Vitals and nursing note reviewed.  Constitutional:      Appearance: Normal appearance.  HENT:     Head: Atraumatic.     Right Ear: Tympanic membrane and external  ear normal.     Left Ear: Tympanic membrane and external ear normal.     Nose: Rhinorrhea present.     Mouth/Throat:     Mouth: Mucous membranes are moist.     Pharynx: No posterior oropharyngeal erythema.  Eyes:     Extraocular Movements: Extraocular movements intact.     Conjunctiva/sclera: Conjunctivae normal.  Cardiovascular:     Rate and Rhythm: Normal rate and regular rhythm.     Heart sounds: Normal heart sounds.  Pulmonary:     Effort: Pulmonary effort is normal.     Breath sounds: Normal breath sounds. No wheezing or rales.  Musculoskeletal:        General: Normal range of motion.     Cervical back: Normal range of motion and neck supple.  Skin:    General: Skin is warm and dry.  Neurological:     Mental Status: She is alert and oriented to person, place, and time.     Motor: No weakness.     Gait: Gait normal.  Psychiatric:        Mood and Affect: Mood normal.        Thought Content: Thought content normal.      UC Treatments / Results  Labs (all labs ordered are listed, but only abnormal results are displayed) Labs Reviewed  POC COVID19/FLU A&B COMBO - Abnormal; Notable for the following components:      Result Value   Covid Antigen, POC Positive (*)    All other components within normal limits    EKG   Radiology No results found.  Procedures Procedures (including critical care time)  Medications Ordered in UC Medications - No data to display  Initial Impression / Assessment and Plan / UC Course  I have reviewed the triage vital signs and the nursing notes.  Pertinent labs & imaging results that were available during my care of the patient were reviewed by me and considered in my medical decision making (see chart for details).     COVID-positive, treat with Zofran, Bromfed syrup, supportive over-the-counter medications and home care.  Work note given.  Return for worsening symptoms.  Final diagnoses:  COVID-19  Discharge Instructions    None    ED Prescriptions     Medication Sig Dispense Auth. Provider   ondansetron (ZOFRAN-ODT) 4 MG disintegrating tablet Take 1 tablet (4 mg total) by mouth every 8 (eight) hours as needed for nausea or vomiting. 20 tablet Particia Nearing, [place]   brompheniramine-pseudoephedrine-DM 30-2-10 MG/5ML syrup Take 5 mLs by mouth 4 (four) times daily as needed. 120 mL Particia Nearing, [place]      PDMP not reviewed this encounter.   Particia Nearing, [place] 10/08/23 1053

## 2023-10-08 NOTE — ED Triage Notes (Signed)
 Pt reports headache, nausea, body aches loss of appetite x 1 day

## 2023-12-26 ENCOUNTER — Ambulatory Visit (INDEPENDENT_AMBULATORY_CARE_PROVIDER_SITE_OTHER)

## 2023-12-26 ENCOUNTER — Other Ambulatory Visit (HOSPITAL_COMMUNITY)
Admission: RE | Admit: 2023-12-26 | Discharge: 2023-12-26 | Disposition: A | Discharge status: Home / Self Care | Source: Ambulatory Visit | Attending: Obstetrics & Gynecology | Admitting: Obstetrics & Gynecology

## 2023-12-26 VITALS — BP 119/75 | HR 79

## 2023-12-26 DIAGNOSIS — N898 Other specified noninflammatory disorders of vagina: Secondary | ICD-10-CM | POA: Diagnosis present

## 2023-12-26 DIAGNOSIS — Z113 Encounter for screening for infections with a predominantly sexual mode of transmission: Secondary | ICD-10-CM | POA: Diagnosis not present

## 2023-12-26 NOTE — Progress Notes (Signed)
 SUBJECTIVE:  23 y.o. female complains of vaginal odor for approximately two weeks. Reports taking boric acid and then cycle came on. Reports noticing two tampons coming out and believes one may have been stuck, and reports experiencing an odor since then. Feels as though there is nothing currently stuck, but told pt if she is concerned we can do a provider visit to assess.  Denies abnormal vaginal bleeding or significant pelvic pain or fever. No UTI symptoms. Denies history of known exposure to STD.  Patient's last menstrual period was 12/21/2023 (approximate).  OBJECTIVE:  She appears well, afebrile. Urine dipstick: not done.  ASSESSMENT:  Vaginal Odor   PLAN:  GC, chlamydia, trichomonas, BVAG, CVAG probe sent to lab. Treatment: To be determined once lab results are received ROV prn if symptoms persist or worsen.

## 2023-12-27 LAB — CERVICOVAGINAL ANCILLARY ONLY
Bacterial Vaginitis (gardnerella): POSITIVE — AB
Candida Glabrata: NEGATIVE
Candida Vaginitis: NEGATIVE
Chlamydia: NEGATIVE
Comment: NEGATIVE
Comment: NEGATIVE
Comment: NEGATIVE
Comment: NEGATIVE
Comment: NEGATIVE
Comment: NORMAL
Neisseria Gonorrhea: NEGATIVE
Trichomonas: NEGATIVE

## 2023-12-28 ENCOUNTER — Ambulatory Visit: Payer: Self-pay | Admitting: Obstetrics and Gynecology

## 2023-12-28 MED ORDER — NUVESSA 1.3 % VA GEL: 1.0000 | Freq: Every evening | VAGINAL | 0 refills | Status: AC

## 2024-03-28 ENCOUNTER — Ambulatory Visit
Admission: EM | Admit: 2024-03-28 | Discharge: 2024-03-28 | Disposition: A | Discharge status: Home / Self Care | Attending: Family Medicine | Admitting: Family Medicine

## 2024-03-28 DIAGNOSIS — M5431 Sciatica, right side: Secondary | ICD-10-CM | POA: Diagnosis not present

## 2024-03-28 DIAGNOSIS — S39012A Strain of muscle, fascia and tendon of lower back, initial encounter: Secondary | ICD-10-CM | POA: Diagnosis not present

## 2024-03-28 MED ORDER — TIZANIDINE HCL 4 MG PO CAPS: 4.0000 mg | ORAL_CAPSULE | Freq: Three times a day (TID) | ORAL | 0 refills | Status: AC | PRN

## 2024-03-28 MED ORDER — PREDNISONE 20 MG PO TABS: 40.0000 mg | ORAL_TABLET | Freq: Every day | ORAL | 0 refills | Status: AC

## 2024-03-28 NOTE — ED Triage Notes (Addendum)
 Pt reports lower back pain, this pain goes down into the right leg and has numbness in the foot and toes x 1 week, some days are worse than others. Denies known injury, Pt is unable to put pressure on the right leg. Pt has tried tylenol  ibuprofen  OTC muscle rubs and epsom salt soaks has found no relief.

## 2024-03-28 NOTE — ED Provider Notes (Signed)
 [facility]    CSN: [id number] Arrival date & time: 03/28/24  1338      History   Chief Complaint No chief complaint on file.   HPI Laurie Davis is a 23 y.o. female.   Presenting today with 1 week history of right lower back pain going down into buttock and now with right leg pain, numbness, tingling worse with weightbearing.  Denies known injury, bowel or bladder incontinence, saddle anesthesias, fever, chills.  History of similar but never this severe.  Trying Tylenol , ibuprofen , muscle rubs, Epsom salt soaks, heating pads with only mild temporary benefit.    Past Medical History:  Diagnosis Date   Medical history non-contributory     Patient Active Problem List   Diagnosis Date Noted   Implantable subdermal contraceptive surveillance 05/08/2019   History of preterm premature rupture of membranes (PPROM) 05/08/2019    Past Surgical History:  Procedure Laterality Date   WISDOM TOOTH EXTRACTION      OB History     Gravida  1   Para  1   Term      Preterm  1   AB      Living  1      SAB      IAB      Ectopic      Multiple      Live Births  1            Home Medications    Prior to Admission medications   Medication Sig Start Date End Date Taking? Authorizing Provider  predniSONE  (DELTASONE ) 20 MG tablet Take 2 tablets (40 mg total) by mouth daily with breakfast. 03/28/24  Yes Stuart Vernell Norris, PA-C  tiZANidine  (ZANAFLEX ) 4 MG capsule Take 1 capsule (4 mg total) by mouth 3 (three) times daily as needed for muscle spasms. Do not drink alcohol or drive while taking this medication.  May cause drowsiness. 03/28/24  Yes Stuart Vernell Norris, PA-C  brompheniramine-pseudoephedrine-DM 30-2-10 MG/5ML syrup Take 5 mLs by mouth 4 (four) times daily as needed. 10/08/23   Stuart Vernell Norris, PA-C  etonogestrel  (NEXPLANON ) 68 MG IMPL implant 1 each by Subdermal route once. 11/10/21   [provider]  ibuprofen  (ADVIL ) 200 MG  tablet Take 200 mg by mouth every 6 (six) hours as needed.    [provider]  metroNIDAZOLE  (FLAGYL ) 500 MG tablet Take 1 tablet (500 mg total) by mouth 2 (two) times daily. Patient not taking: Reported on 12/26/2023 05/11/22   Ervin, Michael L, MD  metroNIDAZOLE  (FLAGYL ) 500 MG tablet Take 1 tablet (500 mg total) by mouth 2 (two) times daily. Patient not taking: Reported on 12/26/2023 11/28/22   Constant, Peggy, MD  ondansetron  (ZOFRAN -ODT) 4 MG disintegrating tablet Take 1 tablet (4 mg total) by mouth every 8 (eight) hours as needed for nausea or vomiting. 10/08/23   Stuart Vernell Norris, PA-C  predniSONE  (DELTASONE ) 20 MG tablet Take 2 tablets (40 mg total) by mouth daily with breakfast. Patient not taking: Reported on 12/26/2023 03/22/22   Stuart Vernell Norris, PA-C  sulfamethoxazole -trimethoprim  (BACTRIM  DS) 800-160 MG tablet Take 1 tablet by mouth 2 (two) times daily. Patient not taking: Reported on 12/26/2023 11/24/22   Hoyle Garre, NP    Family History Family History  Problem Relation Age of Onset   Hypertension Mother    Diabetes Mother    Anxiety disorder Mother    Depression Mother    Obesity Mother    Diabetes Father  Early death Maternal Grandmother     Social History Social History   Tobacco Use   Smoking status: Never   Smokeless tobacco: Never  Vaping Use   Vaping status: Never Used  Substance Use Topics   Alcohol use: No   Drug use: Never     Allergies   Patient has no known allergies.   Review of Systems Review of Systems PER HPI  Physical Exam Triage Vital Signs ED Triage Vitals [03/28/24 1404]  Encounter Vitals Group     BP 117/75     Girls Systolic BP Percentile      Girls Diastolic BP Percentile      Boys Systolic BP Percentile      Boys Diastolic BP Percentile      Pulse Rate 60     Resp 20     Temp 98 F (36.7 C)     Temp Source Oral     SpO2 99 %     Weight      Height      Head Circumference      Peak Flow      Pain  Score 10     Pain Loc      Pain Education      Exclude from Growth Chart    No data found.  Updated Vital Signs BP 117/75 (BP Location: Right Arm)   Pulse 60   Temp 98 F (36.7 C) (Oral)   Resp 20   SpO2 99%   Visual Acuity Right Eye Distance:   Left Eye Distance:   Bilateral Distance:    Right Eye Near:   Left Eye Near:    Bilateral Near:     Physical Exam Vitals and nursing note reviewed.  Constitutional:      Appearance: Normal appearance. She is not ill-appearing.  HENT:     Head: Atraumatic.  Eyes:     Extraocular Movements: Extraocular movements intact.     Conjunctiva/sclera: Conjunctivae normal.  Cardiovascular:     Rate and Rhythm: Normal rate and regular rhythm.  Pulmonary:     Effort: Pulmonary effort is normal.     Breath sounds: Normal breath sounds.  Musculoskeletal:        General: Tenderness present. No swelling or deformity. Normal range of motion.     Cervical back: Normal range of motion and neck supple.     Comments: Localized right lower lumbar musculature tenderness to palpation down into right posterior buttock.  Negative straight leg raise bilateral lower extremities.  Antalgic gait due to pain to the right leg  Skin:    General: Skin is warm and dry.     Findings: No erythema.  Neurological:     Mental Status: She is alert and oriented to person, place, and time.     Motor: No weakness.     Gait: Gait normal.     Comments: Bilateral lower extremities neurovascularly intact  Psychiatric:        Mood and Affect: Mood normal.        Thought Content: Thought content normal.        Judgment: Judgment normal.      UC Treatments / Results  Labs (all labs ordered are listed, but only abnormal results are displayed) Labs Reviewed - No data to display  EKG   Radiology No results found.  Procedures Procedures (including critical care time)  Medications Ordered in UC Medications - No data to display  Initial Impression /  Assessment and  Plan / UC Course  I have reviewed the triage vital signs and the nursing notes.  Pertinent labs & imaging results that were available during my care of the patient were reviewed by me and considered in my medical decision making (see chart for details).     Consistent with muscular strain and right sided sciatica.  Treat with prednisone , Zanaflex , heat, massage, stretches, rest.  Work note given.  Return for worsening symptoms.  Final Clinical Impressions(s) / UC Diagnoses   Final diagnoses:  Strain of lumbar region, initial encounter  Right sided sciatica   Discharge Instructions   None    ED Prescriptions     Medication Sig Dispense Auth. Provider   predniSONE  (DELTASONE ) 20 MG tablet Take 2 tablets (40 mg total) by mouth daily with breakfast. 10 tablet Stuart Vernell Norris, PA-C   tiZANidine  (ZANAFLEX ) 4 MG capsule Take 1 capsule (4 mg total) by mouth 3 (three) times daily as needed for muscle spasms. Do not drink alcohol or drive while taking this medication.  May cause drowsiness. 15 capsule Stuart Vernell Norris, [place]      PDMP not reviewed this encounter.   Stuart Vernell Norris, [place] 03/28/24 1625

## 2024-07-09 ENCOUNTER — Ambulatory Visit: Payer: Self-pay | Admitting: Obstetrics and Gynecology

## 2024-08-05 ENCOUNTER — Ambulatory Visit: Payer: Self-pay | Admitting: Physician Assistant
